# Patient Record
Sex: Male | Born: 1950
Health system: Southern US, Community
[De-identification: ages and names within clinical notes are randomized; demographics above are authoritative.]

## PROBLEM LIST (undated history)

## (undated) DIAGNOSIS — I10 Essential (primary) hypertension: Secondary | ICD-10-CM

## (undated) HISTORY — DX: Essential (primary) hypertension: I10

## (undated) HISTORY — PX: HERNIA REPAIR: SHX51

---

## 2013-06-29 DIAGNOSIS — G8929 Other chronic pain: Secondary | ICD-10-CM | POA: Insufficient documentation

## 2013-07-27 DIAGNOSIS — M5412 Radiculopathy, cervical region: Secondary | ICD-10-CM | POA: Insufficient documentation

## 2014-06-17 DIAGNOSIS — M5010 Cervical disc disorder with radiculopathy, unspecified cervical region: Secondary | ICD-10-CM | POA: Insufficient documentation

## 2016-08-27 DIAGNOSIS — R311 Benign essential microscopic hematuria: Secondary | ICD-10-CM | POA: Insufficient documentation

## 2018-09-18 DIAGNOSIS — R972 Elevated prostate specific antigen [PSA]: Secondary | ICD-10-CM | POA: Insufficient documentation

## 2018-09-18 DIAGNOSIS — R319 Hematuria, unspecified: Secondary | ICD-10-CM | POA: Insufficient documentation

## 2019-04-02 DIAGNOSIS — F1120 Opioid dependence, uncomplicated: Secondary | ICD-10-CM | POA: Insufficient documentation

## 2019-06-18 DIAGNOSIS — I10 Essential (primary) hypertension: Secondary | ICD-10-CM | POA: Insufficient documentation

## 2019-12-01 DIAGNOSIS — M50123 Cervical disc disorder at C6-C7 level with radiculopathy: Secondary | ICD-10-CM | POA: Diagnosis not present

## 2019-12-23 DIAGNOSIS — F1120 Opioid dependence, uncomplicated: Secondary | ICD-10-CM | POA: Diagnosis not present

## 2019-12-23 DIAGNOSIS — M5010 Cervical disc disorder with radiculopathy, unspecified cervical region: Secondary | ICD-10-CM | POA: Diagnosis not present

## 2019-12-24 DIAGNOSIS — I10 Essential (primary) hypertension: Secondary | ICD-10-CM | POA: Diagnosis not present

## 2019-12-24 DIAGNOSIS — E7800 Pure hypercholesterolemia, unspecified: Secondary | ICD-10-CM | POA: Diagnosis not present

## 2019-12-24 DIAGNOSIS — N400 Enlarged prostate without lower urinary tract symptoms: Secondary | ICD-10-CM | POA: Diagnosis not present

## 2019-12-24 DIAGNOSIS — Z6825 Body mass index (BMI) 25.0-25.9, adult: Secondary | ICD-10-CM | POA: Diagnosis not present

## 2020-03-03 DIAGNOSIS — M5010 Cervical disc disorder with radiculopathy, unspecified cervical region: Secondary | ICD-10-CM | POA: Diagnosis not present

## 2020-03-31 DIAGNOSIS — M542 Cervicalgia: Secondary | ICD-10-CM | POA: Diagnosis not present

## 2020-03-31 DIAGNOSIS — F1120 Opioid dependence, uncomplicated: Secondary | ICD-10-CM | POA: Diagnosis not present

## 2020-03-31 DIAGNOSIS — M5010 Cervical disc disorder with radiculopathy, unspecified cervical region: Secondary | ICD-10-CM | POA: Diagnosis not present

## 2020-06-05 DIAGNOSIS — Z20828 Contact with and (suspected) exposure to other viral communicable diseases: Secondary | ICD-10-CM | POA: Diagnosis not present

## 2020-06-05 DIAGNOSIS — J0100 Acute maxillary sinusitis, unspecified: Secondary | ICD-10-CM | POA: Diagnosis not present

## 2020-06-09 DIAGNOSIS — Z1331 Encounter for screening for depression: Secondary | ICD-10-CM | POA: Diagnosis not present

## 2020-06-09 DIAGNOSIS — Z6825 Body mass index (BMI) 25.0-25.9, adult: Secondary | ICD-10-CM | POA: Diagnosis not present

## 2020-06-09 DIAGNOSIS — I10 Essential (primary) hypertension: Secondary | ICD-10-CM | POA: Diagnosis not present

## 2020-06-09 DIAGNOSIS — Z23 Encounter for immunization: Secondary | ICD-10-CM | POA: Diagnosis not present

## 2020-06-09 DIAGNOSIS — M5010 Cervical disc disorder with radiculopathy, unspecified cervical region: Secondary | ICD-10-CM | POA: Diagnosis not present

## 2020-06-09 DIAGNOSIS — E7800 Pure hypercholesterolemia, unspecified: Secondary | ICD-10-CM | POA: Diagnosis not present

## 2020-06-09 DIAGNOSIS — Z0000 Encounter for general adult medical examination without abnormal findings: Secondary | ICD-10-CM | POA: Diagnosis not present

## 2020-07-04 DIAGNOSIS — M542 Cervicalgia: Secondary | ICD-10-CM | POA: Diagnosis not present

## 2020-07-04 DIAGNOSIS — F1120 Opioid dependence, uncomplicated: Secondary | ICD-10-CM | POA: Diagnosis not present

## 2020-07-04 DIAGNOSIS — M5010 Cervical disc disorder with radiculopathy, unspecified cervical region: Secondary | ICD-10-CM | POA: Diagnosis not present

## 2020-07-19 DIAGNOSIS — Z23 Encounter for immunization: Secondary | ICD-10-CM | POA: Diagnosis not present

## 2020-08-11 DIAGNOSIS — I10 Essential (primary) hypertension: Secondary | ICD-10-CM | POA: Diagnosis not present

## 2020-08-11 DIAGNOSIS — E7849 Other hyperlipidemia: Secondary | ICD-10-CM | POA: Diagnosis not present

## 2020-08-26 DIAGNOSIS — M5010 Cervical disc disorder with radiculopathy, unspecified cervical region: Secondary | ICD-10-CM | POA: Diagnosis not present

## 2020-08-26 DIAGNOSIS — F1120 Opioid dependence, uncomplicated: Secondary | ICD-10-CM | POA: Diagnosis not present

## 2020-08-26 DIAGNOSIS — M542 Cervicalgia: Secondary | ICD-10-CM | POA: Diagnosis not present

## 2020-09-11 DIAGNOSIS — D519 Vitamin B12 deficiency anemia, unspecified: Secondary | ICD-10-CM | POA: Diagnosis not present

## 2020-09-11 DIAGNOSIS — I10 Essential (primary) hypertension: Secondary | ICD-10-CM | POA: Diagnosis not present

## 2020-09-11 DIAGNOSIS — E7800 Pure hypercholesterolemia, unspecified: Secondary | ICD-10-CM | POA: Diagnosis not present

## 2020-09-15 DIAGNOSIS — M5412 Radiculopathy, cervical region: Secondary | ICD-10-CM | POA: Diagnosis not present

## 2020-09-21 DIAGNOSIS — Z6825 Body mass index (BMI) 25.0-25.9, adult: Secondary | ICD-10-CM | POA: Diagnosis not present

## 2020-09-21 DIAGNOSIS — E7800 Pure hypercholesterolemia, unspecified: Secondary | ICD-10-CM | POA: Diagnosis not present

## 2020-09-21 DIAGNOSIS — I10 Essential (primary) hypertension: Secondary | ICD-10-CM | POA: Diagnosis not present

## 2020-10-10 DIAGNOSIS — E7849 Other hyperlipidemia: Secondary | ICD-10-CM | POA: Diagnosis not present

## 2020-10-10 DIAGNOSIS — I10 Essential (primary) hypertension: Secondary | ICD-10-CM | POA: Diagnosis not present

## 2020-10-11 DIAGNOSIS — M5010 Cervical disc disorder with radiculopathy, unspecified cervical region: Secondary | ICD-10-CM | POA: Diagnosis not present

## 2020-11-09 DIAGNOSIS — I10 Essential (primary) hypertension: Secondary | ICD-10-CM | POA: Diagnosis not present

## 2020-11-09 DIAGNOSIS — E7849 Other hyperlipidemia: Secondary | ICD-10-CM | POA: Diagnosis not present

## 2020-11-15 DIAGNOSIS — N138 Other obstructive and reflux uropathy: Secondary | ICD-10-CM | POA: Diagnosis not present

## 2020-11-15 DIAGNOSIS — Z87448 Personal history of other diseases of urinary system: Secondary | ICD-10-CM | POA: Diagnosis not present

## 2020-11-15 DIAGNOSIS — N401 Enlarged prostate with lower urinary tract symptoms: Secondary | ICD-10-CM | POA: Diagnosis not present

## 2020-11-15 DIAGNOSIS — R972 Elevated prostate specific antigen [PSA]: Secondary | ICD-10-CM | POA: Diagnosis not present

## 2020-12-10 DIAGNOSIS — E7849 Other hyperlipidemia: Secondary | ICD-10-CM | POA: Diagnosis not present

## 2020-12-10 DIAGNOSIS — I10 Essential (primary) hypertension: Secondary | ICD-10-CM | POA: Diagnosis not present

## 2020-12-22 DIAGNOSIS — M5412 Radiculopathy, cervical region: Secondary | ICD-10-CM | POA: Insufficient documentation

## 2020-12-27 DIAGNOSIS — N138 Other obstructive and reflux uropathy: Secondary | ICD-10-CM | POA: Diagnosis not present

## 2020-12-27 DIAGNOSIS — N401 Enlarged prostate with lower urinary tract symptoms: Secondary | ICD-10-CM | POA: Diagnosis not present

## 2020-12-27 DIAGNOSIS — R972 Elevated prostate specific antigen [PSA]: Secondary | ICD-10-CM | POA: Diagnosis not present

## 2021-01-09 DIAGNOSIS — I10 Essential (primary) hypertension: Secondary | ICD-10-CM | POA: Diagnosis not present

## 2021-01-09 DIAGNOSIS — E7849 Other hyperlipidemia: Secondary | ICD-10-CM | POA: Diagnosis not present

## 2021-01-19 DIAGNOSIS — M5010 Cervical disc disorder with radiculopathy, unspecified cervical region: Secondary | ICD-10-CM | POA: Diagnosis not present

## 2021-01-19 DIAGNOSIS — F1120 Opioid dependence, uncomplicated: Secondary | ICD-10-CM | POA: Diagnosis not present

## 2021-01-19 DIAGNOSIS — M542 Cervicalgia: Secondary | ICD-10-CM | POA: Diagnosis not present

## 2021-01-22 DIAGNOSIS — J324 Chronic pansinusitis: Secondary | ICD-10-CM | POA: Diagnosis not present

## 2021-01-30 DIAGNOSIS — R3912 Poor urinary stream: Secondary | ICD-10-CM | POA: Diagnosis not present

## 2021-01-30 DIAGNOSIS — N401 Enlarged prostate with lower urinary tract symptoms: Secondary | ICD-10-CM | POA: Diagnosis not present

## 2021-01-30 DIAGNOSIS — C61 Malignant neoplasm of prostate: Secondary | ICD-10-CM | POA: Diagnosis not present

## 2021-01-30 DIAGNOSIS — R972 Elevated prostate specific antigen [PSA]: Secondary | ICD-10-CM | POA: Diagnosis not present

## 2021-01-30 DIAGNOSIS — R350 Frequency of micturition: Secondary | ICD-10-CM | POA: Diagnosis not present

## 2021-01-30 DIAGNOSIS — N138 Other obstructive and reflux uropathy: Secondary | ICD-10-CM | POA: Diagnosis not present

## 2021-01-30 DIAGNOSIS — R3911 Hesitancy of micturition: Secondary | ICD-10-CM | POA: Diagnosis not present

## 2021-03-08 DIAGNOSIS — C61 Malignant neoplasm of prostate: Secondary | ICD-10-CM | POA: Insufficient documentation

## 2021-03-12 DIAGNOSIS — I10 Essential (primary) hypertension: Secondary | ICD-10-CM | POA: Diagnosis not present

## 2021-03-12 DIAGNOSIS — E7849 Other hyperlipidemia: Secondary | ICD-10-CM | POA: Diagnosis not present

## 2021-03-17 DIAGNOSIS — C61 Malignant neoplasm of prostate: Secondary | ICD-10-CM | POA: Diagnosis not present

## 2021-03-30 DIAGNOSIS — M5010 Cervical disc disorder with radiculopathy, unspecified cervical region: Secondary | ICD-10-CM | POA: Diagnosis not present

## 2021-04-04 DIAGNOSIS — Z6823 Body mass index (BMI) 23.0-23.9, adult: Secondary | ICD-10-CM | POA: Diagnosis not present

## 2021-04-04 DIAGNOSIS — Z79899 Other long term (current) drug therapy: Secondary | ICD-10-CM | POA: Diagnosis not present

## 2021-04-04 DIAGNOSIS — Z23 Encounter for immunization: Secondary | ICD-10-CM | POA: Diagnosis not present

## 2021-04-04 DIAGNOSIS — E7800 Pure hypercholesterolemia, unspecified: Secondary | ICD-10-CM | POA: Diagnosis not present

## 2021-04-04 DIAGNOSIS — Z8546 Personal history of malignant neoplasm of prostate: Secondary | ICD-10-CM | POA: Diagnosis not present

## 2021-04-04 DIAGNOSIS — I10 Essential (primary) hypertension: Secondary | ICD-10-CM | POA: Diagnosis not present

## 2021-04-18 DIAGNOSIS — Z1212 Encounter for screening for malignant neoplasm of rectum: Secondary | ICD-10-CM | POA: Diagnosis not present

## 2021-04-18 DIAGNOSIS — Z1211 Encounter for screening for malignant neoplasm of colon: Secondary | ICD-10-CM | POA: Diagnosis not present

## 2021-04-23 DIAGNOSIS — R070 Pain in throat: Secondary | ICD-10-CM | POA: Diagnosis not present

## 2021-04-23 DIAGNOSIS — R051 Acute cough: Secondary | ICD-10-CM | POA: Diagnosis not present

## 2021-04-23 DIAGNOSIS — R0981 Nasal congestion: Secondary | ICD-10-CM | POA: Diagnosis not present

## 2021-04-23 DIAGNOSIS — Z20828 Contact with and (suspected) exposure to other viral communicable diseases: Secondary | ICD-10-CM | POA: Diagnosis not present

## 2021-05-02 DIAGNOSIS — F1120 Opioid dependence, uncomplicated: Secondary | ICD-10-CM | POA: Diagnosis not present

## 2021-05-02 DIAGNOSIS — M5416 Radiculopathy, lumbar region: Secondary | ICD-10-CM | POA: Diagnosis not present

## 2021-05-02 DIAGNOSIS — M542 Cervicalgia: Secondary | ICD-10-CM | POA: Diagnosis not present

## 2021-05-02 DIAGNOSIS — M5010 Cervical disc disorder with radiculopathy, unspecified cervical region: Secondary | ICD-10-CM | POA: Diagnosis not present

## 2021-06-06 ENCOUNTER — Encounter: Payer: Self-pay | Admitting: Cardiology

## 2021-06-06 ENCOUNTER — Other Ambulatory Visit: Payer: Self-pay

## 2021-06-06 ENCOUNTER — Ambulatory Visit: Payer: PPO | Admitting: Cardiology

## 2021-06-06 VITALS — BP 128/74 | HR 76 | Ht 68.0 in | Wt 166.2 lb

## 2021-06-06 DIAGNOSIS — I10 Essential (primary) hypertension: Secondary | ICD-10-CM | POA: Diagnosis not present

## 2021-06-06 DIAGNOSIS — R0609 Other forms of dyspnea: Secondary | ICD-10-CM | POA: Diagnosis not present

## 2021-06-06 DIAGNOSIS — Z01812 Encounter for preprocedural laboratory examination: Secondary | ICD-10-CM | POA: Diagnosis not present

## 2021-06-06 DIAGNOSIS — E785 Hyperlipidemia, unspecified: Secondary | ICD-10-CM | POA: Insufficient documentation

## 2021-06-06 DIAGNOSIS — R0789 Other chest pain: Secondary | ICD-10-CM

## 2021-06-06 DIAGNOSIS — R079 Chest pain, unspecified: Secondary | ICD-10-CM | POA: Diagnosis not present

## 2021-06-06 NOTE — Progress Notes (Signed)
Cardiology Office Note:    Date:  06/06/2021   ID:  Shane Rose, DOB [birth year], MRN [id number]  PCP:  Shane Sheriff, MD  Cardiologist:  Shane Campus, MD    Referring MD: Shane Sheriff, MD   Chief Complaint  Patient presents with   Establish Care    History of Present Illness:    Shane Rose is a 70 y.o. male who is a chronic smoker has been smoking since he was 70 years old, also essential hypertension, dyslipidemia.  He was referred to [place] because of dyspnea on exertion as well as some atypical chest pain.  He said dyspnea on exertion got significantly worse within a year.  He is still able to perform activities daily living however climbing stairs which create a problem.  On top of that sometimes he describes some uneasy sensation in the chest when he gets his shortness of breath.  He does not exercise on the regular basis he is not on any special diet he is quite cheerful corrected.  Past Medical History:  Diagnosis Date   Hypertension     Past Surgical History:  Procedure Laterality Date   HERNIA REPAIR     x2    Current Medications: Current Meds  Medication Sig   albuterol (VENTOLIN HFA) 108 (90 Base) MCG/ACT inhaler Inhale 2 puffs into the lungs 3 (three) times daily as needed for shortness of breath.   amLODipine (NORVASC) 5 MG tablet Take 5 mg by mouth daily.   benazepril (LOTENSIN) 5 MG tablet Take 1 tablet by mouth daily.   cyclobenzaprine (FLEXERIL) 5 MG tablet Take 5-10 mg by mouth at bedtime as needed for spasms.   HYDROcodone-acetaminophen (NORCO) 10-325 MG tablet Take 1 tablet by mouth every 6 (six) hours as needed for pain.   lisinopril (ZESTRIL) 40 MG tablet Take 1 tablet by mouth daily.   meloxicam (MOBIC) 15 MG tablet Take 15 mg by mouth daily.   metoprolol succinate (TOPROL-XL) 50 MG 24 hr tablet Take 50 mg by mouth daily.   omeprazole (PRILOSEC) 20 MG capsule Take 20 mg by mouth every morning.   pravastatin (PRAVACHOL) 20 MG tablet  Take 20 mg by mouth at bedtime.   tamsulosin (FLOMAX) 0.4 MG CAPS capsule Take 0.4 mg by mouth 2 (two) times daily.     Allergies:   Patient has no known allergies.   Social History   Socioeconomic History   Marital status: Unknown    Spouse name: Not on file   Number of children: Not on file   Years of education: Not on file   Highest education level: Not on file  Occupational History   Not on file  Tobacco Use   Smoking status: Every Day    Types: Cigarettes   Smokeless tobacco: Never  Substance and Sexual Activity   Alcohol use: Not on file    Comment: Socially   Drug use: Never   Sexual activity: Not Currently  Other Topics Concern   Not on file  Social History Narrative   Not on file   Social Determinants of Health   Financial Resource Strain: Not on file  Food Insecurity: Not on file  Transportation Needs: Not on file  Physical Activity: Not on file  Stress: Not on file  Social Connections: Not on file     Family History: [name] includes COPD in his father; Stroke in his mother. ROS:   Please see the history of present illness.  All 14 point review of systems negative except as described per history of present illness  EKGs/Labs/Other Studies Reviewed:      Recent Labs: No results found for requested labs within last 8760 hours.  Recent Lipid Panel No results found for: CHOL, TRIG, HDL, CHOLHDL, VLDL, LDLCALC, LDLDIRECT  Physical Exam:    VS:  BP 128/74 (BP Location: Left Arm, Patient Position: Sitting)   Pulse 76   Ht 5\' 8"  (1.727 m)   Wt 166 lb 3.2 oz (75.4 kg)   SpO2 94%   BMI 25.27 kg/m     Wt Readings from Last 3 Encounters:  06/06/21 166 lb 3.2 oz (75.4 kg)     GEN:  Well nourished, well developed in no acute distress HEENT: Normal NECK: No JVD; No carotid bruits LYMPHATICS: No lymphadenopathy CARDIAC: RRR, no murmurs, no rubs, no gallops RESPIRATORY:  Clear to auscultation without rales, wheezing or rhonchi   ABDOMEN: Soft, non-tender, non-distended MUSCULOSKELETAL:  No edema; No deformity  SKIN: Warm and dry LOWER EXTREMITIES: no swelling NEUROLOGIC:  Alert and oriented x 3 PSYCHIATRIC:  Normal affect   ASSESSMENT:    1. Essential (primary) hypertension   2. Atypical chest pain   3. Dyspnea on exertion   4. Dyslipidemia    PLAN:    In order of problems listed above:  Atypical chest pain.  Multiple risk factors for coronary artery disease.  I do not think he requires antianginal therapy yet he is already on beta-blocker which is an excellent choice.  He is already taking aspirin which I will continue.  I will schedule him to have coronary CT angio make sure he does not have any significant coronary artery disease.  Additional benefits of coronary CT angio will be to look at his lung parenchyma. Essential hypertension blood pressure seems to be well controlled today we will continue present management. Dyslipidemia he is on pravastatin 20 mg which I will continue for now.  His HDL is 59 LDL 95.  Future recommendation in terms of therapy for dyslipidemia will be made based on results of his CT and the investigation. Dyspnea and exertion echocardiogram will be performed   Medication Adjustments/Labs and Tests Ordered: Current medicines are reviewed at length with the patient today.  Concerns regarding medicines are outlined above.  No orders of the defined types were placed in this encounter.  Medication changes: No orders of the defined types were placed in this encounter.   Signed, Park Liter, MD, [facility] 06/06/2021 4:41 PM    [place]

## 2021-06-06 NOTE — Patient Instructions (Addendum)
Medication Instructions:  Your physician recommends that you continue on your current medications as directed. Please refer to the Current Medication list given to you today.  *If you need a refill on your cardiac medications before your next appointment, please call your pharmacy*   Lab Work: Your physician recommends that you return for lab work in: Today for BMP  If you have labs (blood work) drawn today and your tests are completely normal, you will receive your results only by: [place] (if you have MyChart) OR A paper copy in the mail If you have any lab test that is abnormal or we need to change your treatment, we will call you to review the results.   Testing/Procedures: Your physician has requested that you have an echocardiogram. Echocardiography is a painless test that uses sound waves to create images of your heart. It provides your doctor with information about the size and shape of your heart and how well your heart's chambers and valves are working. This procedure takes approximately one hour. There are no restrictions for this procedure.   Please arrive at the [facility] main entrance of [facility] at xx:xx AM (30-45 minutes prior to test start time)  [facility] [place], [place] [address] [phone]  Proceed to the [facility] Radiology Department (First Floor).  Please follow these instructions carefully (unless otherwise directed):  Hold all erectile dysfunction medications at least 48 hours prior to test.  On the Night Before the Test: Drink plenty of water. Do not consume any caffeinated/decaffeinated beverages or chocolate 12 hours prior to your test. Do not take any antihistamines 12 hours prior to your test.  On the Day of the Test: Drink plenty of water. Do not drink any water within one hour of the test. Do not eat any food 4 hours prior to the test. You may take your regular medications prior to the  test. After the Test: Drink plenty of water. After receiving IV contrast, you may experience a mild flushed feeling. This is normal. On occasion, you may experience a mild rash up to 24 hours after the test. This is not dangerous. If this occurs, you can take Benadryl 25 mg and increase your fluid intake. If you experience trouble breathing, this can be serious. If it is severe call 911 IMMEDIATELY. If it is mild, please call our office. If you take any of these medications: Glipizide/Metformin, Avandament, Glucavance, please do not take 48 hours after completing test.    Follow-Up: At [facility], you and your health needs are our priority.  As part of our continuing mission to provide you with exceptional heart care, we have created designated Provider Care Teams.  These Care Teams include your primary Cardiologist (physician) and Advanced Practice Providers (APPs -  Physician Assistants and Nurse Practitioners) who all work together to provide you with the care you need, when you need it.  We recommend signing up for the patient portal called "MyChart".  Sign up information is provided on this After Visit Summary.  MyChart is used to connect with patients for Virtual Visits (Telemedicine).  Patients are able to view lab/test results, encounter notes, upcoming appointments, etc.  Non-urgent messages can be sent to your provider as well.   To learn more about what you can do with MyChart, go to [email or web address].    Your next appointment:   3 month(s)  The format for your next appointment:   In Person  Provider:  Jenne Campus, MD   Other Instructions

## 2021-06-07 ENCOUNTER — Ambulatory Visit (INDEPENDENT_AMBULATORY_CARE_PROVIDER_SITE_OTHER): Payer: PPO

## 2021-06-07 DIAGNOSIS — R195 Other fecal abnormalities: Secondary | ICD-10-CM | POA: Diagnosis not present

## 2021-06-07 DIAGNOSIS — R634 Abnormal weight loss: Secondary | ICD-10-CM | POA: Diagnosis not present

## 2021-06-07 DIAGNOSIS — R12 Heartburn: Secondary | ICD-10-CM | POA: Diagnosis not present

## 2021-06-07 DIAGNOSIS — R0609 Other forms of dyspnea: Secondary | ICD-10-CM

## 2021-06-07 DIAGNOSIS — K5909 Other constipation: Secondary | ICD-10-CM | POA: Diagnosis not present

## 2021-06-07 LAB — BASIC METABOLIC PANEL
BUN/Creatinine Ratio: 18 (ref 10–24)
BUN: 14 mg/dL (ref 8–27)
CO2: 21 mmol/L (ref 20–29)
Calcium: 9.4 mg/dL (ref 8.6–10.2)
Chloride: 101 mmol/L (ref 96–106)
Creatinine, Ser: 0.77 mg/dL (ref 0.76–1.27)
Glucose: 106 mg/dL — ABNORMAL HIGH (ref 70–99)
Potassium: 4.3 mmol/L (ref 3.5–5.2)
Sodium: 137 mmol/L (ref 134–144)
eGFR: 98 mL/min/{1.73_m2} (ref >59)

## 2021-06-07 LAB — ECHOCARDIOGRAM COMPLETE
Area-P 1/2: 3.03 cm2
S' Lateral: 3.2 cm

## 2021-06-08 ENCOUNTER — Telehealth: Payer: Self-pay

## 2021-06-08 NOTE — Telephone Encounter (Signed)
-----   Message from Park Liter, MD sent at 06/08/2021 11:17 AM EDT ----- Echocardiogram showed preserved left ventricle ejection fraction, left atrium moderately enlarged.  Otherwise looks good

## 2021-06-08 NOTE — Telephone Encounter (Signed)
-----   Message from Park Liter, MD sent at 06/08/2021 11:12 AM EDT ----- Chem-7 looks good, okay to proceed with coronary CT

## 2021-06-08 NOTE — Telephone Encounter (Signed)
Called patient. Patient made aware of the results. Verbalized understanding. No questions or concerns expressed at this time.

## 2021-06-20 DIAGNOSIS — Z6824 Body mass index (BMI) 24.0-24.9, adult: Secondary | ICD-10-CM | POA: Diagnosis not present

## 2021-06-20 DIAGNOSIS — K2970 Gastritis, unspecified, without bleeding: Secondary | ICD-10-CM | POA: Diagnosis not present

## 2021-06-20 DIAGNOSIS — R195 Other fecal abnormalities: Secondary | ICD-10-CM | POA: Diagnosis not present

## 2021-06-20 DIAGNOSIS — K644 Residual hemorrhoidal skin tags: Secondary | ICD-10-CM | POA: Diagnosis not present

## 2021-06-20 DIAGNOSIS — K5730 Diverticulosis of large intestine without perforation or abscess without bleeding: Secondary | ICD-10-CM | POA: Diagnosis not present

## 2021-06-20 DIAGNOSIS — K259 Gastric ulcer, unspecified as acute or chronic, without hemorrhage or perforation: Secondary | ICD-10-CM | POA: Diagnosis not present

## 2021-06-20 DIAGNOSIS — K5733 Diverticulitis of large intestine without perforation or abscess with bleeding: Secondary | ICD-10-CM | POA: Diagnosis not present

## 2021-06-20 DIAGNOSIS — K921 Melena: Secondary | ICD-10-CM | POA: Diagnosis not present

## 2021-06-20 DIAGNOSIS — C61 Malignant neoplasm of prostate: Secondary | ICD-10-CM | POA: Diagnosis not present

## 2021-06-20 DIAGNOSIS — R634 Abnormal weight loss: Secondary | ICD-10-CM | POA: Diagnosis not present

## 2021-06-20 DIAGNOSIS — K648 Other hemorrhoids: Secondary | ICD-10-CM | POA: Diagnosis not present

## 2021-06-20 DIAGNOSIS — F17210 Nicotine dependence, cigarettes, uncomplicated: Secondary | ICD-10-CM | POA: Diagnosis not present

## 2021-06-26 ENCOUNTER — Other Ambulatory Visit (HOSPITAL_COMMUNITY): Payer: Self-pay | Admitting: *Deleted

## 2021-06-26 ENCOUNTER — Telehealth (HOSPITAL_COMMUNITY): Payer: Self-pay | Admitting: *Deleted

## 2021-06-26 MED ORDER — METOPROLOL TARTRATE 100 MG PO TABS: ORAL_TABLET | ORAL | 0 refills | Status: DC

## 2021-06-26 NOTE — Telephone Encounter (Signed)
Reaching out to patient to offer assistance regarding upcoming cardiac imaging study; pt verbalizes understanding of appt date/time, parking situation and where to check in, pre-test NPO status and medications ordered, and verified current allergies; name and call back number provided for further questions should they arise  Gordy Clement RN Navigator Cardiac Imaging [facility] [phone] office [phone] cell  Patient to take 100mg  metoprolol tartrate two hours prior to cardiac CT scan.  He was informed to arrive at 2pm for his 2:30pm scan.

## 2021-06-28 ENCOUNTER — Other Ambulatory Visit: Payer: Self-pay

## 2021-06-28 ENCOUNTER — Ambulatory Visit (HOSPITAL_COMMUNITY)
Admission: RE | Admit: 2021-06-28 | Discharge: 2021-06-28 | Disposition: A | Discharge status: Home / Self Care | Payer: PPO | Source: Ambulatory Visit | Attending: Cardiology | Admitting: Cardiology

## 2021-06-28 DIAGNOSIS — R079 Chest pain, unspecified: Secondary | ICD-10-CM | POA: Diagnosis not present

## 2021-06-28 DIAGNOSIS — I2510 Atherosclerotic heart disease of native coronary artery without angina pectoris: Secondary | ICD-10-CM | POA: Insufficient documentation

## 2021-06-28 DIAGNOSIS — I700 Atherosclerosis of aorta: Secondary | ICD-10-CM | POA: Diagnosis not present

## 2021-06-28 MED ORDER — IOHEXOL 350 MG/ML SOLN
95.0000 mL | Freq: Once | INTRAVENOUS | Status: AC | PRN
  Administered 2021-06-28: 95 mL via INTRAVENOUS

## 2021-06-28 MED ORDER — NITROGLYCERIN 0.4 MG SL SUBL
0.8000 mg | SUBLINGUAL_TABLET | Freq: Once | SUBLINGUAL | Status: AC
  Administered 2021-06-28: 0.8 mg via SUBLINGUAL

## 2021-06-28 MED ORDER — NITROGLYCERIN 0.4 MG SL SUBL
SUBLINGUAL_TABLET | SUBLINGUAL | Status: AC
  Filled 2021-06-28: qty 2

## 2021-07-13 DIAGNOSIS — M5010 Cervical disc disorder with radiculopathy, unspecified cervical region: Secondary | ICD-10-CM | POA: Diagnosis not present

## 2021-07-27 DIAGNOSIS — M62838 Other muscle spasm: Secondary | ICD-10-CM | POA: Diagnosis not present

## 2021-07-27 DIAGNOSIS — F1120 Opioid dependence, uncomplicated: Secondary | ICD-10-CM | POA: Diagnosis not present

## 2021-07-27 DIAGNOSIS — M5010 Cervical disc disorder with radiculopathy, unspecified cervical region: Secondary | ICD-10-CM | POA: Diagnosis not present

## 2021-08-23 DIAGNOSIS — K5909 Other constipation: Secondary | ICD-10-CM | POA: Diagnosis not present

## 2021-08-23 DIAGNOSIS — R634 Abnormal weight loss: Secondary | ICD-10-CM | POA: Diagnosis not present

## 2021-08-23 DIAGNOSIS — K5790 Diverticulosis of intestine, part unspecified, without perforation or abscess without bleeding: Secondary | ICD-10-CM | POA: Diagnosis not present

## 2021-08-23 DIAGNOSIS — K259 Gastric ulcer, unspecified as acute or chronic, without hemorrhage or perforation: Secondary | ICD-10-CM | POA: Diagnosis not present

## 2021-09-06 ENCOUNTER — Ambulatory Visit: Payer: PPO | Admitting: Cardiology

## 2021-09-06 ENCOUNTER — Other Ambulatory Visit: Payer: Self-pay

## 2021-09-06 ENCOUNTER — Encounter: Payer: Self-pay | Admitting: Cardiology

## 2021-09-06 VITALS — BP 138/72 | HR 77 | Ht 69.0 in | Wt 172.2 lb

## 2021-09-06 DIAGNOSIS — R0609 Other forms of dyspnea: Secondary | ICD-10-CM | POA: Diagnosis not present

## 2021-09-06 DIAGNOSIS — R0789 Other chest pain: Secondary | ICD-10-CM

## 2021-09-06 DIAGNOSIS — I10 Essential (primary) hypertension: Secondary | ICD-10-CM | POA: Diagnosis not present

## 2021-09-06 DIAGNOSIS — F17200 Nicotine dependence, unspecified, uncomplicated: Secondary | ICD-10-CM | POA: Diagnosis not present

## 2021-09-06 DIAGNOSIS — E785 Hyperlipidemia, unspecified: Secondary | ICD-10-CM

## 2021-09-06 NOTE — Progress Notes (Signed)
Cardiology Office Note:    Date:  09/06/2021   ID:  Shane Rose, DOB 12/25/1950, MRN [id number]  PCP:  Angelina Sheriff, MD  Cardiologist:  Jenne Campus, MD    Referring MD: Angelina Sheriff, MD   Chief Complaint  Patient presents with   Follow-up  I am doing fine  History of Present Illness:    Shane Rose is a 71 y.o. male with significant epistaxis requiring artery disease namely essential hypertension, dyslipidemia, chronic smoking he was referred to [place] because of dyspnea on exertion as well as some tightness in the chest that happen with exertion.  He also had some atypical chest pain not related to exertion.  He did have coronary CT angiogram for which showed only minimal disease she is very happy about results of this test.  Sadly he still continues to smoke.  He said he is asymptomatic he can walk climb stairs with no difficulty shortness of breath is still there.  Past Medical History:  Diagnosis Date   Hypertension     Past Surgical History:  Procedure Laterality Date   HERNIA REPAIR     x2    Current Medications: Current Meds  Medication Sig   albuterol (VENTOLIN HFA) 108 (90 Base) MCG/ACT inhaler Inhale 2 puffs into the lungs 3 (three) times daily as needed for shortness of breath.   amLODipine (NORVASC) 5 MG tablet Take 5 mg by mouth daily.   benazepril (LOTENSIN) 5 MG tablet Take 1 tablet by mouth daily.   cyclobenzaprine (FLEXERIL) 5 MG tablet Take 5-10 mg by mouth at bedtime as needed for spasms.   HYDROcodone-acetaminophen (NORCO) 10-325 MG tablet Take 1 tablet by mouth every 6 (six) hours as needed for pain.   lisinopril (ZESTRIL) 40 MG tablet Take 1 tablet by mouth daily.   meloxicam (MOBIC) 15 MG tablet Take 15 mg by mouth daily.   metoprolol succinate (TOPROL-XL) 50 MG 24 hr tablet Take 50 mg by mouth daily.   omeprazole (PRILOSEC) 40 MG capsule Take 40 mg by mouth every morning.   pravastatin (PRAVACHOL) 20 MG tablet Take 20 mg by mouth at  bedtime.   tamsulosin (FLOMAX) 0.4 MG CAPS capsule Take 0.4 mg by mouth 2 (two) times daily.     Allergies:   Patient has no known allergies.   Social History   Socioeconomic History   Marital status: Unknown    Spouse name: Not on file   Number of children: Not on file   Years of education: Not on file   Highest education level: Not on file  Occupational History   Not on file  Tobacco Use   Smoking status: Every Day    Types: Cigarettes   Smokeless tobacco: Never  Substance and Sexual Activity   Alcohol use: Not on file    Comment: Socially   Drug use: Never   Sexual activity: Not Currently  Other Topics Concern   Not on file  Social History Narrative   Not on file   Social Determinants of Health   Financial Resource Strain: Not on file  Food Insecurity: Not on file  Transportation Needs: Not on file  Physical Activity: Not on file  Stress: Not on file  Social Connections: Not on file     Family History: [name] includes COPD in his father; Stroke in his mother. ROS:   Please see the history of present illness.    All 14 point review of systems negative except  as described per history of present illness  EKGs/Labs/Other Studies Reviewed:      Recent Labs: 06/06/2021: BUN 14; Creatinine, Ser 0.77; Potassium 4.3; Sodium 137  Recent Lipid Panel No results found for: CHOL, TRIG, HDL, CHOLHDL, VLDL, LDLCALC, LDLDIRECT  Physical Exam:    VS:  BP 138/72 (BP Location: Left Arm, Patient Position: Sitting)    Pulse 77    Ht 5\' 9"  (1.753 m)    Wt 172 lb 3.2 oz (78.1 kg)    SpO2 96%    BMI 25.43 kg/m     Wt Readings from Last 3 Encounters:  09/06/21 172 lb 3.2 oz (78.1 kg)  06/06/21 166 lb 3.2 oz (75.4 kg)     GEN:  Well nourished, well developed in no acute distress HEENT: Normal NECK: No JVD; No carotid bruits LYMPHATICS: No lymphadenopathy CARDIAC: RRR, no murmurs, no rubs, no gallops RESPIRATORY:  Clear to auscultation without rales,  wheezing or rhonchi  ABDOMEN: Soft, non-tender, non-distended MUSCULOSKELETAL:  No edema; No deformity  SKIN: Warm and dry LOWER EXTREMITIES: no swelling NEUROLOGIC:  Alert and oriented x 3 PSYCHIATRIC:  Normal affect   ASSESSMENT:    1. Atypical chest pain   2. Dyspnea on exertion   3. Dyslipidemia   4. Essential (primary) hypertension    PLAN:    In order of problems listed above:  Atypical chest pain.  Coronary CT angio showed only minimal disease.  We will continue risk factors modifications Dyspnea on exertion undoubtedly related to smoking I strongly recommend to quit he understand he will try to work on that. Essential hypertension blood pressure well controlled continue present management. Dyslipidemia , Statin 20.  I will recheck his fasting lipid profile anticipate need to augment his therapy I would like to see his LDL less than 70.   Medication Adjustments/Labs and Tests Ordered: Current medicines are reviewed at length with the patient today.  Concerns regarding medicines are outlined above.  No orders of the defined types were placed in this encounter.  Medication changes: No orders of the defined types were placed in this encounter.   Signed, Park Liter, MD, [facility] 09/06/2021 2:37 PM    [place]

## 2021-09-06 NOTE — Patient Instructions (Signed)
Medication Instructions:  Your physician recommends that you continue on your current medications as directed. Please refer to the Current Medication list given to you today.  *If you need a refill on your cardiac medications before your next appointment, please call your pharmacy*   Lab Work: Your physician recommends that you return for lab work in: Labs: Lipids If you have labs (blood work) drawn today and your tests are completely normal, you will receive your results only by: MyChart Message (if you have MyChart) OR A paper copy in the mail If you have any lab test that is abnormal or we need to change your treatment, we will call you to review the results.   Testing/Procedures: None   Follow-Up: At [facility], you and your health needs are our priority.  As part of our continuing mission to provide you with exceptional heart care, we have created designated Provider Care Teams.  These Care Teams include your primary Cardiologist (physician) and Advanced Practice Providers (APPs -  Physician Assistants and Nurse Practitioners) who all work together to provide you with the care you need, when you need it.  We recommend signing up for the patient portal called "MyChart".  Sign up information is provided on this After Visit Summary.  MyChart is used to connect with patients for Virtual Visits (Telemedicine).  Patients are able to view lab/test results, encounter notes, upcoming appointments, etc.  Non-urgent messages can be sent to your provider as well.   To learn more about what you can do with MyChart, go to [email or web address].    Your next appointment:   6 month(s)  The format for your next appointment:   In Person  Provider:   Jenne Campus, MD    Other Instructions None

## 2021-09-06 NOTE — Addendum Note (Signed)
Addended by: Edwyna Shell I on: 09/06/2021 03:06 PM   Modules accepted: Orders

## 2021-10-26 DIAGNOSIS — M5412 Radiculopathy, cervical region: Secondary | ICD-10-CM | POA: Diagnosis not present

## 2021-10-30 DIAGNOSIS — R0981 Nasal congestion: Secondary | ICD-10-CM | POA: Diagnosis not present

## 2021-10-30 DIAGNOSIS — J209 Acute bronchitis, unspecified: Secondary | ICD-10-CM | POA: Diagnosis not present

## 2021-10-30 DIAGNOSIS — R051 Acute cough: Secondary | ICD-10-CM | POA: Diagnosis not present

## 2021-10-30 DIAGNOSIS — Z20828 Contact with and (suspected) exposure to other viral communicable diseases: Secondary | ICD-10-CM | POA: Diagnosis not present

## 2021-11-08 DIAGNOSIS — R972 Elevated prostate specific antigen [PSA]: Secondary | ICD-10-CM | POA: Diagnosis not present

## 2021-11-15 DIAGNOSIS — C61 Malignant neoplasm of prostate: Secondary | ICD-10-CM | POA: Diagnosis not present

## 2021-11-16 DIAGNOSIS — M5010 Cervical disc disorder with radiculopathy, unspecified cervical region: Secondary | ICD-10-CM | POA: Diagnosis not present

## 2021-11-16 DIAGNOSIS — F1120 Opioid dependence, uncomplicated: Secondary | ICD-10-CM | POA: Diagnosis not present

## 2022-01-23 DIAGNOSIS — C61 Malignant neoplasm of prostate: Secondary | ICD-10-CM | POA: Diagnosis not present

## 2022-01-31 DIAGNOSIS — N401 Enlarged prostate with lower urinary tract symptoms: Secondary | ICD-10-CM | POA: Diagnosis not present

## 2022-01-31 DIAGNOSIS — N138 Other obstructive and reflux uropathy: Secondary | ICD-10-CM | POA: Insufficient documentation

## 2022-01-31 DIAGNOSIS — C61 Malignant neoplasm of prostate: Secondary | ICD-10-CM | POA: Diagnosis not present

## 2022-02-15 DIAGNOSIS — M5412 Radiculopathy, cervical region: Secondary | ICD-10-CM | POA: Diagnosis not present

## 2022-03-06 ENCOUNTER — Ambulatory Visit: Payer: PPO | Admitting: Cardiology

## 2022-03-06 ENCOUNTER — Encounter: Payer: Self-pay | Admitting: Cardiology

## 2022-03-06 VITALS — BP 128/60 | HR 64 | Ht 69.0 in | Wt 170.2 lb

## 2022-03-06 DIAGNOSIS — I2510 Atherosclerotic heart disease of native coronary artery without angina pectoris: Secondary | ICD-10-CM

## 2022-03-06 DIAGNOSIS — E785 Hyperlipidemia, unspecified: Secondary | ICD-10-CM | POA: Diagnosis not present

## 2022-03-06 DIAGNOSIS — F17200 Nicotine dependence, unspecified, uncomplicated: Secondary | ICD-10-CM

## 2022-03-06 DIAGNOSIS — I10 Essential (primary) hypertension: Secondary | ICD-10-CM

## 2022-03-06 NOTE — Progress Notes (Signed)
Cardiology Office Note:    Date:  03/06/2022   ID:  Savio Albrecht, DOB [birth year], MRN [id number]  PCP:  Angelina Sheriff, MD  Cardiologist:  Jenne Campus, MD    Referring MD: Angelina Sheriff, MD   Chief Complaint  Patient presents with   Follow-up  Doing well  History of Present Illness:    Maurizio Geno is a 71 y.o. male past medical history significant for essential hypertension, dyslipidemia, chronic smoking.  He was referred to [place] because of dyspnea on exertion.  Cardiac work-up was unrevealing with ejection fraction normal by echocardiogram coronary CT angio showing only minimal disease involving LAD and RCA.  The key right now is risk factors modifications.  He comes today to my office for follow-up.  Shortness of breath is still there.  He cannot work in a hot environment.  Denies have any chest pain tightness squeezing pressure burning chest  Past Medical History:  Diagnosis Date   Hypertension     Past Surgical History:  Procedure Laterality Date   HERNIA REPAIR     x2    Current Medications: Current Meds  Medication Sig   albuterol (VENTOLIN HFA) 108 (90 Base) MCG/ACT inhaler Inhale 2 puffs into the lungs 3 (three) times daily as needed for shortness of breath.   amLODipine (NORVASC) 5 MG tablet Take 5 mg by mouth daily.   benazepril (LOTENSIN) 5 MG tablet Take 1 tablet by mouth daily.   cyclobenzaprine (FLEXERIL) 5 MG tablet Take 5-10 mg by mouth at bedtime as needed for spasms.   lisinopril (ZESTRIL) 40 MG tablet Take 1 tablet by mouth daily.   meloxicam (MOBIC) 15 MG tablet Take 15 mg by mouth daily.   metoprolol succinate (TOPROL-XL) 50 MG 24 hr tablet Take 50 mg by mouth daily.   omeprazole (PRILOSEC) 40 MG capsule Take 40 mg by mouth every morning.   pravastatin (PRAVACHOL) 20 MG tablet Take 20 mg by mouth at bedtime.   tamsulosin (FLOMAX) 0.4 MG CAPS capsule Take 0.4 mg by mouth 2 (two) times daily.     Allergies:   Patient has no known  allergies.   Social History   Socioeconomic History   Marital status: Unknown    Spouse name: Not on file   Number of children: Not on file   Years of education: Not on file   Highest education level: Not on file  Occupational History   Not on file  Tobacco Use   Smoking status: Every Day    Types: Cigarettes   Smokeless tobacco: Never  Substance and Sexual Activity   Alcohol use: Not on file    Comment: Socially   Drug use: Never   Sexual activity: Not Currently  Other Topics Concern   Not on file  Social History Narrative   Not on file   Social Determinants of Health   Financial Resource Strain: Not on file  Food Insecurity: Not on file  Transportation Needs: Not on file  Physical Activity: Not on file  Stress: Not on file  Social Connections: Not on file     Family History: [name] includes COPD in his father; Stroke in his mother. ROS:   Please see the history of present illness.    All 14 point review of systems negative except as described per history of present illness  EKGs/Labs/Other Studies Reviewed:      Recent Labs: 06/06/2021: BUN 14; Creatinine, Ser 0.77; Potassium 4.3; Sodium 137  Recent  Lipid Panel No results found for: "CHOL", "TRIG", "HDL", "CHOLHDL", "VLDL", "LDLCALC", "LDLDIRECT"  Physical Exam:    VS:  BP 128/60 (BP Location: Left Arm, Patient Position: Sitting)   Pulse 64   Ht '5\' 9"'$  (1.753 m)   Wt 170 lb 3.2 oz (77.2 kg)   SpO2 94%   BMI 25.13 kg/m     Wt Readings from Last 3 Encounters:  03/06/22 170 lb 3.2 oz (77.2 kg)  09/06/21 172 lb 3.2 oz (78.1 kg)  06/06/21 166 lb 3.2 oz (75.4 kg)     GEN:  Well nourished, well developed in no acute distress HEENT: Normal NECK: No JVD; No carotid bruits LYMPHATICS: No lymphadenopathy CARDIAC: RRR, no murmurs, no rubs, no gallops RESPIRATORY:  Clear to auscultation without rales, wheezing or rhonchi  ABDOMEN: Soft, non-tender, non-distended MUSCULOSKELETAL:  No  edema; No deformity  SKIN: Warm and dry LOWER EXTREMITIES: no swelling NEUROLOGIC:  Alert and oriented x 3 PSYCHIATRIC:  Normal affect   ASSESSMENT:    1. Essential (primary) hypertension   2. Coronary artery disease involving native coronary artery of native heart without angina pectoris   3. Smoking   4. Dyslipidemia    PLAN:    In order of problems listed above:  Essential hypertension blood pressure seems to well controlled continue present management. Dyslipidemia I did review his K PN which show me his LDL of 95 and HDL of 59.  He is on pravastatin 20.  I will ask him to have another direct LDL done today to intensify his therapy. Smoking obviously huge problem we spent at least 10 minutes talking about the details however he can reduce the amount of cigarettes he smokes.  He promised me to try Coronary disease stable the key is risk factors modifications   Medication Adjustments/Labs and Tests Ordered: Current medicines are reviewed at length with the patient today.  Concerns regarding medicines are outlined above.  No orders of the defined types were placed in this encounter.  Medication changes: No orders of the defined types were placed in this encounter.   Signed, Park Liter, MD, [facility] 03/06/2022 3:07 PM    [place]

## 2022-03-06 NOTE — Patient Instructions (Signed)
Medication Instructions:  Your physician recommends that you continue on your current medications as directed. Please refer to the Current Medication list given to you today.  *If you need a refill on your cardiac medications before your next appointment, please call your pharmacy*   Lab Work: Your physician recommends that you return for lab work in: Direct LDL today If you have labs (blood work) drawn today and your tests are completely normal, you will receive your results only by: [facility] (if you have MyChart) OR A paper copy in the mail If you have any lab test that is abnormal or we need to change your treatment, we will call you to review the results.   Testing/Procedures: None   Follow-Up: At [facility], you and your health needs are our priority.  As part of our continuing mission to provide you with exceptional heart care, we have created designated Provider Care Teams.  These Care Teams include your primary Cardiologist (physician) and Advanced Practice Providers (APPs -  Physician Assistants and Nurse Practitioners) who all work together to provide you with the care you need, when you need it.  We recommend signing up for the patient portal called "MyChart".  Sign up information is provided on this After Visit Summary.  MyChart is used to connect with patients for Virtual Visits (Telemedicine).  Patients are able to view lab/test results, encounter notes, upcoming appointments, etc.  Non-urgent messages can be sent to your provider as well.   To learn more about what you can do with MyChart, go to [email or web address].    Your next appointment:   1 year(s)  The format for your next appointment:   In Person  Provider:   Jenne Campus, MD    Other Instructions   Important Information About Sugar

## 2022-03-06 NOTE — Addendum Note (Signed)
Addended by: Darrel Reach on: 03/06/2022 03:10 PM   Modules accepted: Orders

## 2022-03-07 LAB — LDL CHOLESTEROL, DIRECT: LDL Direct: 78 mg/dL (ref 0–99)

## 2022-03-09 ENCOUNTER — Telehealth: Payer: Self-pay

## 2022-03-09 DIAGNOSIS — I2510 Atherosclerotic heart disease of native coronary artery without angina pectoris: Secondary | ICD-10-CM

## 2022-03-09 DIAGNOSIS — E785 Hyperlipidemia, unspecified: Secondary | ICD-10-CM

## 2022-03-09 MED ORDER — PRAVASTATIN SODIUM 40 MG PO TABS: 40.0000 mg | ORAL_TABLET | Freq: Every evening | ORAL | 2 refills | Status: DC

## 2022-03-09 NOTE — Telephone Encounter (Signed)
-----   Message from Park Liter, MD sent at 03/07/2022 10:48 AM EDT ----- Cholesterol still on the higher side I prefer to increase dose of pravastatin to 40 mg daily, fasting lipid profile, AST ALT in 6 weeks

## 2022-03-09 NOTE — Telephone Encounter (Signed)
Patient notified of results and recommendations and agreed with plan. Rx sent. He is aware he can double up on his 20 mg daily until it runs out than, there will be a script at his pharmacy with new dose. Aware to be fasting for labs in 6 weeks. Lab order on file.  

## 2022-03-13 DIAGNOSIS — Z7982 Long term (current) use of aspirin: Secondary | ICD-10-CM | POA: Diagnosis not present

## 2022-03-13 DIAGNOSIS — C61 Malignant neoplasm of prostate: Secondary | ICD-10-CM | POA: Diagnosis not present

## 2022-03-13 DIAGNOSIS — Z791 Long term (current) use of non-steroidal anti-inflammatories (NSAID): Secondary | ICD-10-CM | POA: Diagnosis not present

## 2022-03-13 DIAGNOSIS — N138 Other obstructive and reflux uropathy: Secondary | ICD-10-CM | POA: Diagnosis not present

## 2022-03-13 DIAGNOSIS — N400 Enlarged prostate without lower urinary tract symptoms: Secondary | ICD-10-CM | POA: Diagnosis not present

## 2022-03-13 DIAGNOSIS — N401 Enlarged prostate with lower urinary tract symptoms: Secondary | ICD-10-CM | POA: Diagnosis not present

## 2022-03-13 DIAGNOSIS — Z79899 Other long term (current) drug therapy: Secondary | ICD-10-CM | POA: Diagnosis not present

## 2022-03-13 DIAGNOSIS — F17210 Nicotine dependence, cigarettes, uncomplicated: Secondary | ICD-10-CM | POA: Diagnosis not present

## 2022-03-13 DIAGNOSIS — Z01810 Encounter for preprocedural cardiovascular examination: Secondary | ICD-10-CM | POA: Diagnosis not present

## 2022-03-20 DIAGNOSIS — M5010 Cervical disc disorder with radiculopathy, unspecified cervical region: Secondary | ICD-10-CM | POA: Diagnosis not present

## 2022-03-20 DIAGNOSIS — F1120 Opioid dependence, uncomplicated: Secondary | ICD-10-CM | POA: Diagnosis not present

## 2022-03-21 DIAGNOSIS — N138 Other obstructive and reflux uropathy: Secondary | ICD-10-CM | POA: Diagnosis not present

## 2022-03-21 DIAGNOSIS — R8290 Unspecified abnormal findings in urine: Secondary | ICD-10-CM | POA: Diagnosis not present

## 2022-03-21 DIAGNOSIS — C61 Malignant neoplasm of prostate: Secondary | ICD-10-CM | POA: Diagnosis not present

## 2022-03-21 DIAGNOSIS — N401 Enlarged prostate with lower urinary tract symptoms: Secondary | ICD-10-CM | POA: Diagnosis not present

## 2022-03-22 DIAGNOSIS — I10 Essential (primary) hypertension: Secondary | ICD-10-CM | POA: Diagnosis not present

## 2022-03-22 DIAGNOSIS — K219 Gastro-esophageal reflux disease without esophagitis: Secondary | ICD-10-CM | POA: Diagnosis not present

## 2022-03-22 DIAGNOSIS — Z79899 Other long term (current) drug therapy: Secondary | ICD-10-CM | POA: Diagnosis not present

## 2022-03-22 DIAGNOSIS — Z0000 Encounter for general adult medical examination without abnormal findings: Secondary | ICD-10-CM | POA: Diagnosis not present

## 2022-03-22 DIAGNOSIS — Z6827 Body mass index (BMI) 27.0-27.9, adult: Secondary | ICD-10-CM | POA: Diagnosis not present

## 2022-03-22 DIAGNOSIS — E7800 Pure hypercholesterolemia, unspecified: Secondary | ICD-10-CM | POA: Diagnosis not present

## 2022-03-22 DIAGNOSIS — Z1331 Encounter for screening for depression: Secondary | ICD-10-CM | POA: Diagnosis not present

## 2022-03-29 DIAGNOSIS — D510 Vitamin B12 deficiency anemia due to intrinsic factor deficiency: Secondary | ICD-10-CM | POA: Diagnosis not present

## 2022-05-12 ENCOUNTER — Other Ambulatory Visit: Payer: Self-pay | Admitting: Cardiology

## 2022-05-24 DIAGNOSIS — M5013 Cervical disc disorder with radiculopathy, cervicothoracic region: Secondary | ICD-10-CM | POA: Diagnosis not present

## 2022-06-26 DIAGNOSIS — F1120 Opioid dependence, uncomplicated: Secondary | ICD-10-CM | POA: Diagnosis not present

## 2022-06-26 DIAGNOSIS — M5010 Cervical disc disorder with radiculopathy, unspecified cervical region: Secondary | ICD-10-CM | POA: Diagnosis not present

## 2022-08-28 DIAGNOSIS — M5010 Cervical disc disorder with radiculopathy, unspecified cervical region: Secondary | ICD-10-CM | POA: Diagnosis not present

## 2022-09-10 ENCOUNTER — Other Ambulatory Visit: Payer: Self-pay | Admitting: Cardiology

## 2022-09-17 ENCOUNTER — Other Ambulatory Visit: Payer: Self-pay | Admitting: Cardiology

## 2022-09-21 DIAGNOSIS — R051 Acute cough: Secondary | ICD-10-CM | POA: Diagnosis not present

## 2022-09-21 DIAGNOSIS — J0100 Acute maxillary sinusitis, unspecified: Secondary | ICD-10-CM | POA: Diagnosis not present

## 2022-09-21 DIAGNOSIS — R0981 Nasal congestion: Secondary | ICD-10-CM | POA: Diagnosis not present

## 2022-09-27 DIAGNOSIS — F1120 Opioid dependence, uncomplicated: Secondary | ICD-10-CM | POA: Diagnosis not present

## 2022-09-27 DIAGNOSIS — M5010 Cervical disc disorder with radiculopathy, unspecified cervical region: Secondary | ICD-10-CM | POA: Diagnosis not present

## 2022-11-07 DIAGNOSIS — C61 Malignant neoplasm of prostate: Secondary | ICD-10-CM | POA: Diagnosis not present

## 2022-11-29 DIAGNOSIS — M5010 Cervical disc disorder with radiculopathy, unspecified cervical region: Secondary | ICD-10-CM | POA: Diagnosis not present

## 2022-12-20 DIAGNOSIS — I10 Essential (primary) hypertension: Secondary | ICD-10-CM | POA: Diagnosis not present

## 2022-12-20 DIAGNOSIS — M1990 Unspecified osteoarthritis, unspecified site: Secondary | ICD-10-CM | POA: Diagnosis not present

## 2022-12-20 DIAGNOSIS — E7800 Pure hypercholesterolemia, unspecified: Secondary | ICD-10-CM | POA: Diagnosis not present

## 2022-12-26 DIAGNOSIS — F1120 Opioid dependence, uncomplicated: Secondary | ICD-10-CM | POA: Diagnosis not present

## 2022-12-26 DIAGNOSIS — M5010 Cervical disc disorder with radiculopathy, unspecified cervical region: Secondary | ICD-10-CM | POA: Diagnosis not present

## 2023-01-02 DIAGNOSIS — M542 Cervicalgia: Secondary | ICD-10-CM | POA: Diagnosis not present

## 2023-01-02 DIAGNOSIS — I2510 Atherosclerotic heart disease of native coronary artery without angina pectoris: Secondary | ICD-10-CM | POA: Diagnosis not present

## 2023-01-02 DIAGNOSIS — F17210 Nicotine dependence, cigarettes, uncomplicated: Secondary | ICD-10-CM | POA: Diagnosis not present

## 2023-01-02 DIAGNOSIS — G8929 Other chronic pain: Secondary | ICD-10-CM | POA: Diagnosis not present

## 2023-01-02 DIAGNOSIS — I10 Essential (primary) hypertension: Secondary | ICD-10-CM | POA: Diagnosis not present

## 2023-01-02 DIAGNOSIS — Z79899 Other long term (current) drug therapy: Secondary | ICD-10-CM | POA: Diagnosis not present

## 2023-01-02 DIAGNOSIS — C61 Malignant neoplasm of prostate: Secondary | ICD-10-CM | POA: Diagnosis not present

## 2023-03-05 DIAGNOSIS — M5010 Cervical disc disorder with radiculopathy, unspecified cervical region: Secondary | ICD-10-CM | POA: Diagnosis not present

## 2023-03-10 IMAGING — CT CT HEART MORP W/ CTA COR W/ SCORE W/ CA W/CM &/OR W/O CM
4 of 7 series · 8 of 20 positions shown, 9 images · IV contrast (APPLIED)
Comparison: None.
COMPARISON: None.

Addendum:
EXAM:
OVER-READ INTERPRETATION  CT CHEST

The following report is an over-read performed by radiologist Dr.
Hardeep Singh Ijaz [REDACTED] on 06/28/2021. This
over-read does not include interpretation of cardiac or coronary
anatomy or pathology. The coronary calcium score/coronary CTA
interpretation by the cardiologist is attached.
CLINICAL DATA: CP
Cardiac/Coronary  CTA
TECHNIQUE: The patient was scanned on a [employer] scanner.

[Series 6: ts diast sharp · axial · 0.39mm/px · z∈[+1116,+1159]mm · 2 of 326 slices shown]
[im 109/326  lung]
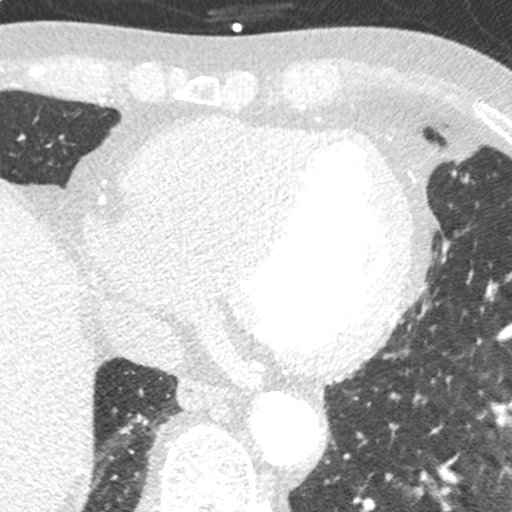
[im 217/326  lung]
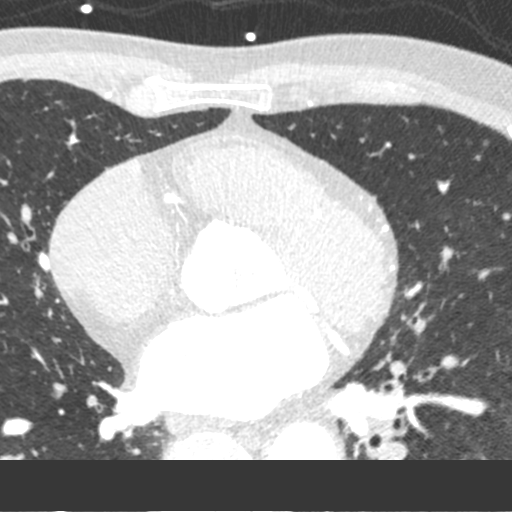

[Series 7: ts syst sharp · axial · 0.39mm/px · z∈[+1116,+1159]mm · 2 of 326 slices shown]
[im 109/326  lung]
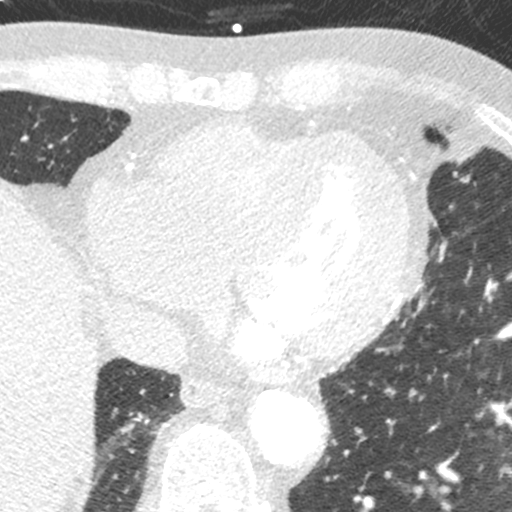
[im 217/326  lung]
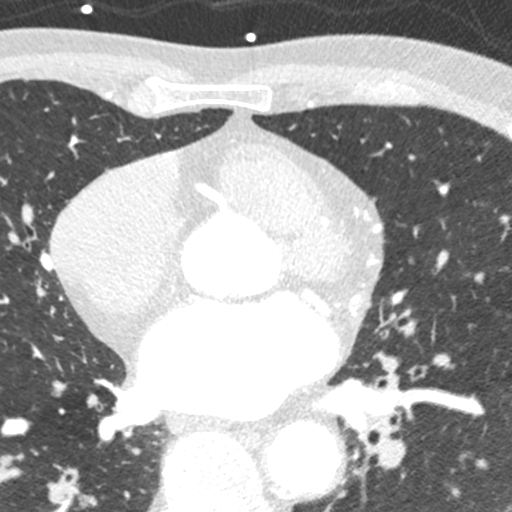

[Series 8: best syst · axial · 0.39mm/px · z∈[+1116,+1159]mm · 2 of 326 slices shown, 3 images]
[im 109/326  vessel]
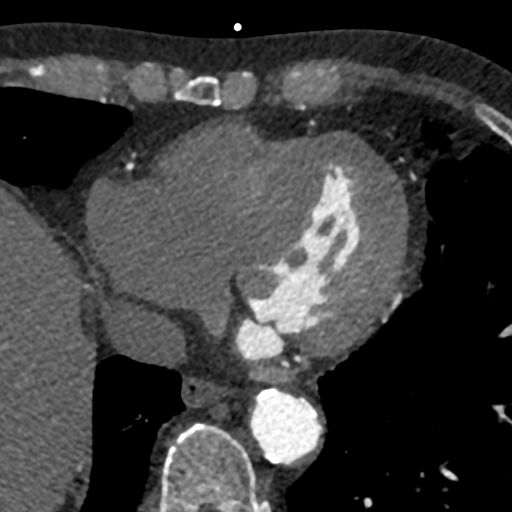
[im 109/326  lung]
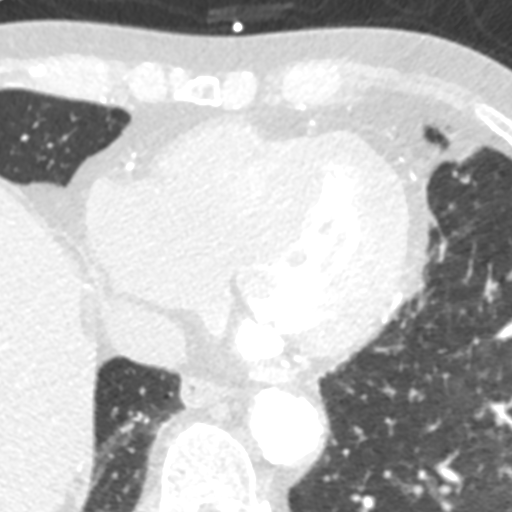
[im 217/326  vessel]
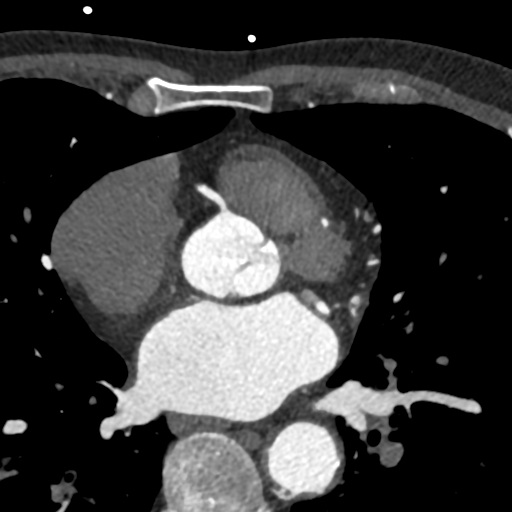

[Series 9: best diast · axial · 0.39mm/px · z∈[+1116,+1159]mm · 2 of 326 slices shown]
[im 109/326  vessel]
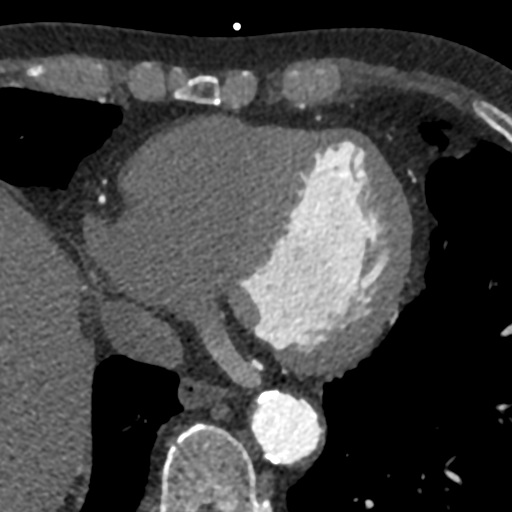
[im 217/326  vessel]
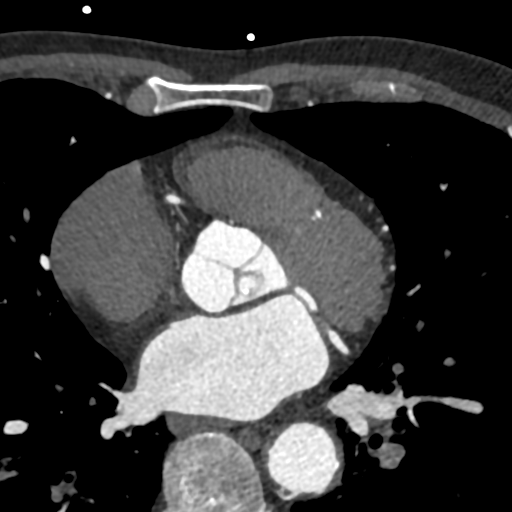

[8 of 20 positions shown; findings below may reference images not displayed]

FINDINGS: Atherosclerotic calcifications in the thoracic aorta. Within the
visualized portions of the thorax there are no suspicious appearing
pulmonary nodules or masses, there is no acute consolidative
airspace disease, no pleural effusions, no pneumothorax and no
lymphadenopathy. Visualized portions of the upper abdomen are
unremarkable. There are no aggressive appearing lytic or blastic
lesions noted in the visualized portions of the skeleton.
IMPRESSION: 1.  Aortic Atherosclerosis (RK41C-84M.M).
FINDINGS: A 120 kV prospective scan was triggered in the descending thoracic
aorta at 111 HU's. Axial non-contrast 3 mm slices were carried out
through the heart. The data set was analyzed on a dedicated work
station and scored using the Agatson method. Gantry rotation speed
was 250 msecs and collimation was .6 mm. No beta blockade and 0.8 mg
of sl NTG was given. The 3D data set was reconstructed in 5%
intervals of the 67-82 % of the R-R cycle. Diastolic phases were
analyzed on a dedicated work station using MPR, MIP and VRT modes.
The patient received 80 cc of contrast.

Aorta: Normal size. Moderate calcifications in the descending
thoracic aorta. No dissection.

Aortic Valve:  Trileaflet.  Mild calcifications.

Coronary Arteries:  Normal coronary origin.  Right dominance.

RCA is a large dominant artery that gives rise to PDA and PLA. There
is soft, non-obstructive minimal (0-25% stenosis) plaque in the mid
portion of the RCA.

Left main is a large artery that gives rise to LAD and LCX arteries.

LAD is a large vessel that has calcified, non-obstructive, minimal
(0-25% stenosis) plaque in the proximal portion of this artery. LAD
gives rise to large D1. D1 gives rise to moderate size branch in its
proximal portion. Both are free of disease.

LCX is a non-dominant artery that gives rise to one large OM1
branch. There is no plaque.

Other findings:

Normal pulmonary vein drainage into the left atrium.

Normal left atrial appendage without a thrombus.

Normal size of the pulmonary artery.
IMPRESSION: 1. Coronary calcium score of 17.8. This was 27 percentile for age
and sex matched control.

2. Normal coronary origin with right dominance.

3. CAD-RADS 1. Minimal non-obstructive CAD (0-24%). Consider
non-atherosclerotic causes of chest pain. Consider preventive
therapy and risk factor modification.

4. Moderate atherosclerosis of the aorta noted.

*** End of Addendum ***
EXAM:
OVER-READ INTERPRETATION  CT CHEST

The following report is an over-read performed by radiologist Dr.
Hardeep Singh Ijaz [REDACTED] on 06/28/2021. This
over-read does not include interpretation of cardiac or coronary
anatomy or pathology. The coronary calcium score/coronary CTA
interpretation by the cardiologist is attached.
FINDINGS: Atherosclerotic calcifications in the thoracic aorta. Within the
visualized portions of the thorax there are no suspicious appearing
pulmonary nodules or masses, there is no acute consolidative
airspace disease, no pleural effusions, no pneumothorax and no
lymphadenopathy. Visualized portions of the upper abdomen are
unremarkable. There are no aggressive appearing lytic or blastic
lesions noted in the visualized portions of the skeleton.
IMPRESSION: 1.  Aortic Atherosclerosis (RK41C-84M.M).

## 2023-03-14 ENCOUNTER — Other Ambulatory Visit: Payer: Self-pay | Admitting: Cardiology

## 2023-03-19 DIAGNOSIS — M5010 Cervical disc disorder with radiculopathy, unspecified cervical region: Secondary | ICD-10-CM | POA: Diagnosis not present

## 2023-03-19 DIAGNOSIS — F1120 Opioid dependence, uncomplicated: Secondary | ICD-10-CM | POA: Diagnosis not present

## 2023-04-30 DIAGNOSIS — Z23 Encounter for immunization: Secondary | ICD-10-CM | POA: Diagnosis not present

## 2023-05-15 ENCOUNTER — Other Ambulatory Visit: Payer: Self-pay | Admitting: Cardiology

## 2023-05-15 NOTE — Telephone Encounter (Signed)
Rx refill sent to pharmacy. 

## 2023-06-06 DIAGNOSIS — M5412 Radiculopathy, cervical region: Secondary | ICD-10-CM | POA: Diagnosis not present

## 2023-06-13 ENCOUNTER — Other Ambulatory Visit: Payer: Self-pay | Admitting: Cardiology

## 2023-06-28 DIAGNOSIS — F1120 Opioid dependence, uncomplicated: Secondary | ICD-10-CM | POA: Diagnosis not present

## 2023-06-28 DIAGNOSIS — M5010 Cervical disc disorder with radiculopathy, unspecified cervical region: Secondary | ICD-10-CM | POA: Diagnosis not present

## 2023-06-29 ENCOUNTER — Other Ambulatory Visit: Payer: Self-pay | Admitting: Cardiology

## 2023-07-16 DIAGNOSIS — R39198 Other difficulties with micturition: Secondary | ICD-10-CM | POA: Diagnosis not present

## 2023-07-16 DIAGNOSIS — C61 Malignant neoplasm of prostate: Secondary | ICD-10-CM | POA: Diagnosis not present
